# Patient Record
Sex: Female | Born: 1970 | Race: White | Hispanic: No | Smoking: Former smoker
Health system: Southern US, Community
[De-identification: ages and names within clinical notes are randomized; demographics above are authoritative.]

## PROBLEM LIST (undated history)

## (undated) HISTORY — PX: LASIK: SHX215

## (undated) HISTORY — PX: OTHER SURGICAL HISTORY: SHX169

---

## 2015-11-13 DIAGNOSIS — L638 Other alopecia areata: Secondary | ICD-10-CM | POA: Diagnosis not present

## 2016-08-28 DIAGNOSIS — R000 Tachycardia, unspecified: Secondary | ICD-10-CM | POA: Diagnosis not present

## 2016-08-28 DIAGNOSIS — Z0000 Encounter for general adult medical examination without abnormal findings: Secondary | ICD-10-CM | POA: Diagnosis not present

## 2016-08-28 DIAGNOSIS — L659 Nonscarring hair loss, unspecified: Secondary | ICD-10-CM | POA: Diagnosis not present

## 2016-08-28 DIAGNOSIS — R8299 Other abnormal findings in urine: Secondary | ICD-10-CM | POA: Diagnosis not present

## 2016-08-28 DIAGNOSIS — Z6822 Body mass index (BMI) 22.0-22.9, adult: Secondary | ICD-10-CM | POA: Diagnosis not present

## 2016-08-28 DIAGNOSIS — Z23 Encounter for immunization: Secondary | ICD-10-CM | POA: Diagnosis not present

## 2016-09-02 ENCOUNTER — Other Ambulatory Visit: Payer: Self-pay | Admitting: Internal Medicine

## 2016-09-02 DIAGNOSIS — Z1231 Encounter for screening mammogram for malignant neoplasm of breast: Secondary | ICD-10-CM

## 2016-09-24 ENCOUNTER — Ambulatory Visit
Admission: RE | Admit: 2016-09-24 | Discharge: 2016-09-24 | Disposition: A | Discharge status: Home / Self Care | Payer: BLUE CROSS/BLUE SHIELD | Source: Ambulatory Visit | Attending: Internal Medicine | Admitting: Internal Medicine

## 2016-09-24 DIAGNOSIS — Z1231 Encounter for screening mammogram for malignant neoplasm of breast: Secondary | ICD-10-CM | POA: Diagnosis not present

## 2017-01-09 DIAGNOSIS — Z6822 Body mass index (BMI) 22.0-22.9, adult: Secondary | ICD-10-CM | POA: Diagnosis not present

## 2017-01-09 DIAGNOSIS — E559 Vitamin D deficiency, unspecified: Secondary | ICD-10-CM | POA: Diagnosis not present

## 2017-01-09 DIAGNOSIS — R5381 Other malaise: Secondary | ICD-10-CM | POA: Diagnosis not present

## 2017-01-09 DIAGNOSIS — E611 Iron deficiency: Secondary | ICD-10-CM | POA: Diagnosis not present

## 2017-05-28 ENCOUNTER — Encounter: Payer: Self-pay | Admitting: Obstetrics & Gynecology

## 2017-05-28 ENCOUNTER — Ambulatory Visit: Payer: BLUE CROSS/BLUE SHIELD | Admitting: Obstetrics & Gynecology

## 2017-05-28 VITALS — BP 128/70 | Ht 61.75 in | Wt 117.0 lb

## 2017-05-28 DIAGNOSIS — Z1151 Encounter for screening for human papillomavirus (HPV): Secondary | ICD-10-CM | POA: Diagnosis not present

## 2017-05-28 DIAGNOSIS — L659 Nonscarring hair loss, unspecified: Secondary | ICD-10-CM

## 2017-05-28 DIAGNOSIS — Z113 Encounter for screening for infections with a predominantly sexual mode of transmission: Secondary | ICD-10-CM | POA: Diagnosis not present

## 2017-05-28 DIAGNOSIS — H02729 Madarosis of unspecified eye, unspecified eyelid and periocular area: Secondary | ICD-10-CM

## 2017-05-28 DIAGNOSIS — Z01419 Encounter for gynecological examination (general) (routine) without abnormal findings: Secondary | ICD-10-CM | POA: Diagnosis not present

## 2017-05-28 DIAGNOSIS — Z3049 Encounter for surveillance of other contraceptives: Secondary | ICD-10-CM | POA: Diagnosis not present

## 2017-05-28 NOTE — Progress Notes (Signed)
Deborah Mayo 24-Nov-1970 [id number]   History:    46 y.o. G0 boyfriend times 3 years.  RP:  New patient presenting for annual gyn exam  HPI: Normal menstrual periods every month.  Normal flow.  No pelvic pain.  Stable boyfriend for 3 years.  Using the diaphragm.  Would like STD screening.  Breasts normal.  Last mammogram April 2018 normal.  Urine and bowel movements normal.  Complains of hair loss and loss of her eyebrows.  Health labs with family physician.  Past medical history,surgical history, family history and social history were all reviewed and documented in the EPIC chart.  Gynecologic History Patient's last menstrual period was 05/07/2017. Contraception: diaphragm Last Pap: 2015. Results were: normal Last mammogram: 2018. Results were: Normal  Obstetric History OB History  Gravida Para Term Preterm AB Living  0 0 0 0 0 0  SAB TAB Ectopic Multiple Live Births  0 0 0 0 0         ROS: A ROS was performed and pertinent positives and negatives are included in the history.  GENERAL: No fevers or chills. HEENT: No change in vision, no earache, sore throat or sinus congestion. NECK: No pain or stiffness. CARDIOVASCULAR: No chest pain or pressure. No palpitations. PULMONARY: No shortness of breath, cough or wheeze. GASTROINTESTINAL: No abdominal pain, nausea, vomiting or diarrhea, melena or bright red blood per rectum. GENITOURINARY: No urinary frequency, urgency, hesitancy or dysuria. MUSCULOSKELETAL: No joint or muscle pain, no back pain, no recent trauma. DERMATOLOGIC: No rash, no itching, no lesions. ENDOCRINE: No polyuria, polydipsia, no heat or cold intolerance. No recent change in weight. HEMATOLOGICAL: No anemia or easy bruising or bleeding. NEUROLOGIC: No headache, seizures, numbness, tingling or weakness. PSYCHIATRIC: No depression, no loss of interest in normal activity or change in sleep pattern.     Exam:   BP 128/70   Ht 5' 1.75" (1.568 m)   Wt 117 lb (53.1 kg)    LMP 05/07/2017   BMI 21.57 kg/m   Body mass index is 21.57 kg/m.  General appearance : Well developed well nourished female. No acute distress HEENT: Eyes: No retinal hemorrhage or exudates,  Neck supple, trachea midline, no carotid bruits, no thyroidmegaly Skin:  Partial loss of her eyebrows Lungs: Clear to auscultation, no rhonchi or wheezes, or rib retractions  Heart: Regular rate and rhythm, no murmurs or gallops Breast:Examined in sitting and supine position were symmetrical in appearance, no palpable masses or tenderness,  no skin retraction, no nipple inversion, no nipple discharge, no skin discoloration, no axillary or supraclavicular lymphadenopathy Abdomen: no palpable masses or tenderness, no rebound or guarding Extremities: no edema or skin discoloration or tenderness  Pelvic: Vulva normal  Bartholin, Urethra, Skene Glands: Within normal limits             Vagina: No gross lesions or discharge  Cervix: No gross lesions or discharge.  Pap/HPV HR/Gono-Chlam done.  Uterus  AV, normal size, shape and consistency, non-tender and mobile  Adnexa  Without masses or tenderness  Anus and perineum  normal    Assessment/Plan:  46 y.o. female for annual exam   1. Encounter for routine gynecological examination with Papanicolaou smear of cervix Normal gynecologic exam.  Pap test with high risk HPV done.  Breast exam normal.  Screening mammogram negative in 2018.  Health labs with family physician.  - Vitamin D 1,25 dihydroxy  2. Encounter for surveillance of other contraceptive Using a diaphragm.  3. Hair loss  Rule out endocrine disorder.   - Testos,Total,Free and SHBG (Female) - TSH - Prolactin  4. 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  Seen by dermatology and no skin disorder found.  Offered referral to endocrinology but prefers to wait on above results.  5. Screen for STD (sexually transmitted disease) Recommend condom use - HIV antibody (with  reflex) - RPR - Hepatitis B Surface AntiGEN - Hepatitis C Antibody  Counseling on above issues more than 50% for 20 minutes.  Deborah DelMarie-Lyne Baron Parmelee MD, 12:22 PM 05/28/2017

## 2017-05-29 LAB — PROLACTIN: PROLACTIN: 6.9 ng/mL

## 2017-05-29 LAB — TSH: TSH: 1.33 mIU/L

## 2017-05-31 ENCOUNTER — Encounter: Payer: Self-pay | Admitting: Obstetrics & Gynecology

## 2017-05-31 NOTE — Patient Instructions (Signed)
1. Encounter for routine gynecological examination with Papanicolaou smear of cervix Normal gynecologic exam.  Pap test with high risk HPV done.  Breast exam normal.  Screening mammogram negative in 2018.  Health labs with family physician.  - Vitamin D 1,25 dihydroxy  2. Encounter for surveillance of other contraceptive Using a diaphragm.  3. Hair loss Rule out endocrine disorder.   - Testos,Total,Free and SHBG (Female) - TSH - Prolactin  4. 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  Seen by dermatology and no skin disorder found.  Offered referral to endocrinology but prefers to wait on above results.  5. Screen for STD (sexually transmitted disease) Recommend condom use - HIV antibody (with reflex) - RPR - Hepatitis B Surface AntiGEN - Hepatitis C Antibody  Deborah Mayo, it was a pleasure meeting you today!  I will inform you of your lab results as soon as available.   Alopecia Areata, Adult Alopecia areata is a condition that causes you to lose hair. You may lose hair on your scalp in patches. In some cases, you may lose all the hair on your scalp (alopecia totalis) or all the hair from your face and body (alopecia universalis). Alopecia areata is an autoimmune disease. This means that your body's defense system (immune system) mistakes normal parts of the body for germs or other things that can make you sick. When you have alopecia areata, the immune system attacks the hair follicles. Alopecia areata usually develops in childhood, but it can develop at any age. For some people, their hair grows back on its own and hair loss does not happen again. For others, their hair may fall out and grow back in cycles. The hair loss may last many years. Having this condition can be emotionally difficult, but it is not dangerous. What are the causes? The cause of this condition is not known. What increases the risk? This condition is more likely to develop in people who have:  A  family history of alopecia.  A family history of another autoimmune disease, including type 1 diabetes and rheumatoid arthritis.  Asthma and allergies.  Down syndrome.  What are the signs or symptoms? Round spots of patchy hair loss on the scalp is the main symptom of this condition. The spots may be mildly itchy. Other symptoms include:  Short dark hairs in the bald patches that are wider at the top (exclamation point hairs).  Dents, white spots, or lines in the fingernails or toenails.  Balding and body hair loss. This is rare.  How is this diagnosed? This condition is diagnosed based on your symptoms and family history. Your health care provider will also check your scalp skin, teeth, and nails. Your health care provider may refer you to a specialist in hair and skin disorders (dermatologist). You may also have tests, including:  A hair pull test.  Blood tests or other screening tests to check for autoimmune diseases, such as thyroid disease or diabetes.  Skin biopsy to confirm the diagnosis.  A procedure to examine the skin with a lighted magnifying instrument (dermoscopy).  How is this treated? There is no cure for alopecia areata. Treatment is aimed at promoting the regrowth of hair and preventing the immune system from overreacting. No single treatment is right for all people with alopecia areata. It depends on the type of hair loss you have and how severe it is. Work with your health care provider to find the best treatment for you. Treatment may include:  Having  regular checkups to make sure the condition is not getting worse (watchful waiting).  Steroid creams or pills for 6-8 weeks to stop the immune reaction and help hair to regrow more quickly.  Other topical medicines to alter the immune system response and support the hair growth cycle.  Steroid injections.  Therapy and counseling with a support group or therapist if you are having trouble coping with hair  loss.  Follow these instructions at home:  Learn as much as you can about your condition.  Apply topical creams only as told by your health care provider.  Take over-the-counter and prescription medicines only as told by your health care provider.  Consider getting a wig or products to make hair look fuller or to cover bald spots, if you feel uncomfortable with your appearance.  Get therapy or counseling if you are having a hard time coping with hair loss. Ask your health care provider to recommend a counselor or support group.  Keep all follow-up visits as told by your health care provider. This is important. Contact a health care provider if:  Your hair loss gets worse, even with treatment.  You have new symptoms.  You are struggling emotionally. Summary  Alopecia areata is an autoimmune condition that makes your body's defense system (immune system) attack the hair follicles. This causes you to lose hair.  Treatments may include regular checkups to make sure that the condition is not getting worse (watchful waiting), medicines, and steroid injections. This information is not intended to replace advice given to you by your health care provider. Make sure you discuss any questions you have with your health care provider. Document Released: 01/12/2004 Document Revised: 06/27/2016 Document Reviewed: 06/27/2016 Elsevier Interactive Patient Education  2018 [employer]

## 2017-06-01 LAB — HEPATITIS C ANTIBODY
Hepatitis C Ab: NONREACTIVE
SIGNAL TO CUT-OFF: 0.02 (ref <1.00)

## 2017-06-01 LAB — PAP IG, CT-NG NAA, HPV HIGH-RISK
C. trachomatis RNA, TMA: NOT DETECTED
HPV DNA High Risk: NOT DETECTED
N. GONORRHOEAE RNA, TMA: NOT DETECTED

## 2017-06-01 LAB — HIV ANTIBODY (ROUTINE TESTING W REFLEX): HIV 1&2 Ab, 4th Generation: NONREACTIVE

## 2017-06-01 LAB — VITAMIN D 1,25 DIHYDROXY
VITAMIN D 1, 25 (OH) TOTAL: 44 pg/mL (ref 18–72)
Vitamin D2 1, 25 (OH)2: <8 pg/mL
Vitamin D3 1, 25 (OH)2: 44 pg/mL

## 2017-06-01 LAB — TESTOS,TOTAL,FREE AND SHBG (FEMALE): Sex Hormone Binding: 83 nmol/L (ref 17–124)

## 2017-06-01 LAB — HEPATITIS B SURFACE ANTIGEN: HEP B S AG: NONREACTIVE

## 2017-06-01 LAB — RPR: RPR: NONREACTIVE

## 2017-08-27 DIAGNOSIS — E559 Vitamin D deficiency, unspecified: Secondary | ICD-10-CM | POA: Diagnosis not present

## 2017-08-27 DIAGNOSIS — Z0000 Encounter for general adult medical examination without abnormal findings: Secondary | ICD-10-CM | POA: Diagnosis not present

## 2017-09-03 DIAGNOSIS — Z6823 Body mass index (BMI) 23.0-23.9, adult: Secondary | ICD-10-CM | POA: Diagnosis not present

## 2017-09-03 DIAGNOSIS — Z1389 Encounter for screening for other disorder: Secondary | ICD-10-CM | POA: Diagnosis not present

## 2017-09-03 DIAGNOSIS — Z0000 Encounter for general adult medical examination without abnormal findings: Secondary | ICD-10-CM | POA: Diagnosis not present

## 2017-09-21 IMAGING — MG 2D DIGITAL SCREENING BILATERAL MAMMOGRAM WITH CAD AND ADJUNCT TO
9 of 13 series · 9 of 29 positions shown · non-contrast
Comparison: None.

CLINICAL DATA: Screening.

EXAM:
2D DIGITAL SCREENING BILATERAL MAMMOGRAM WITH CAD AND ADJUNCT TOMO

[L XCCL]
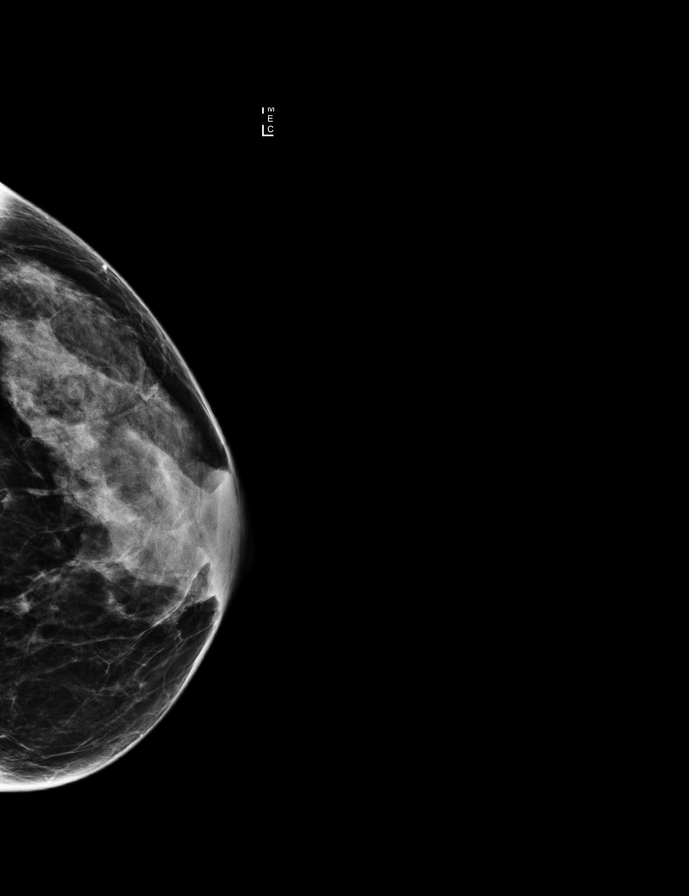

[R MLO]
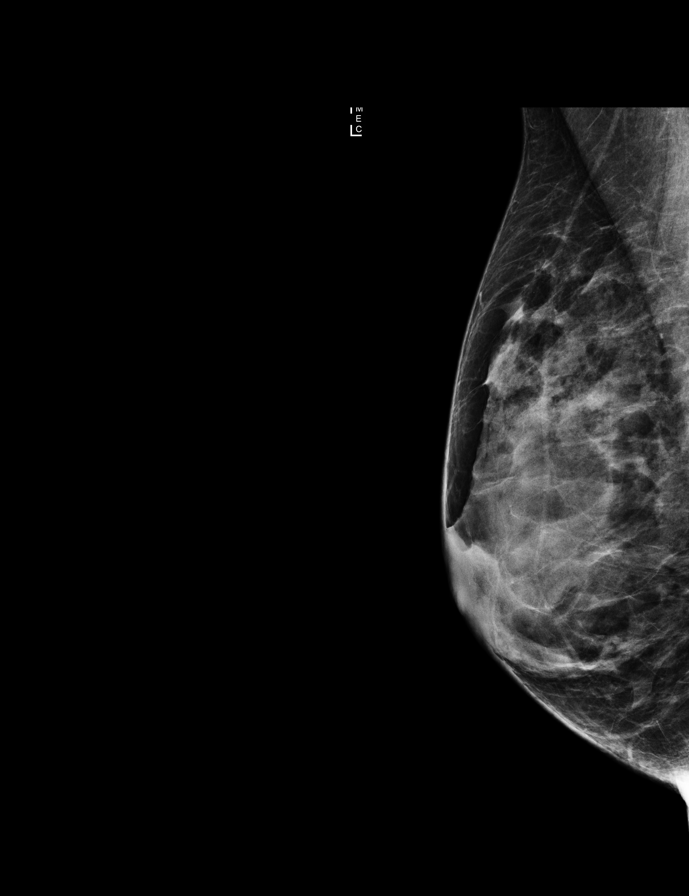

[R MLO synth-2D]
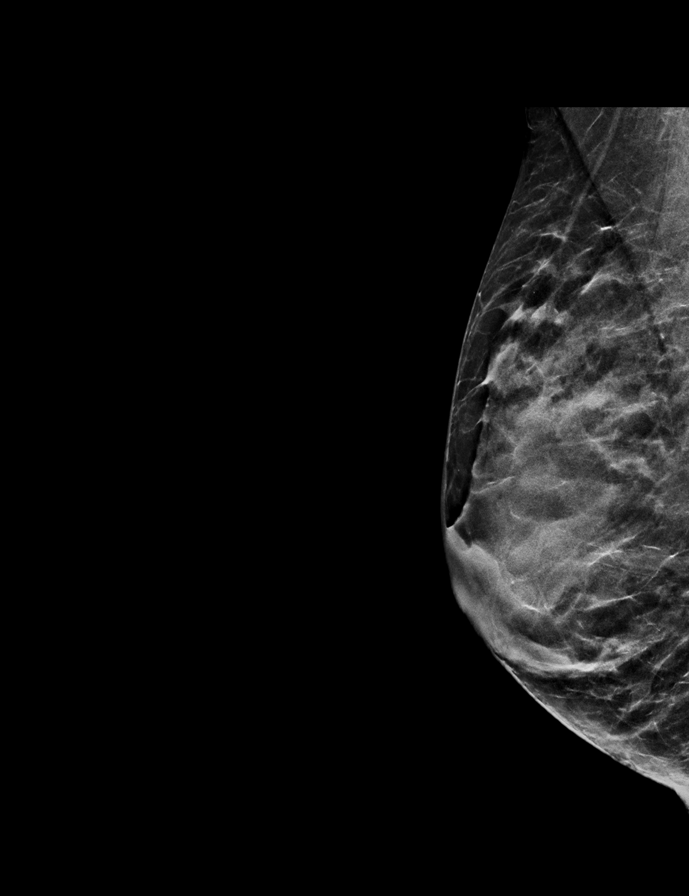

[R CC]
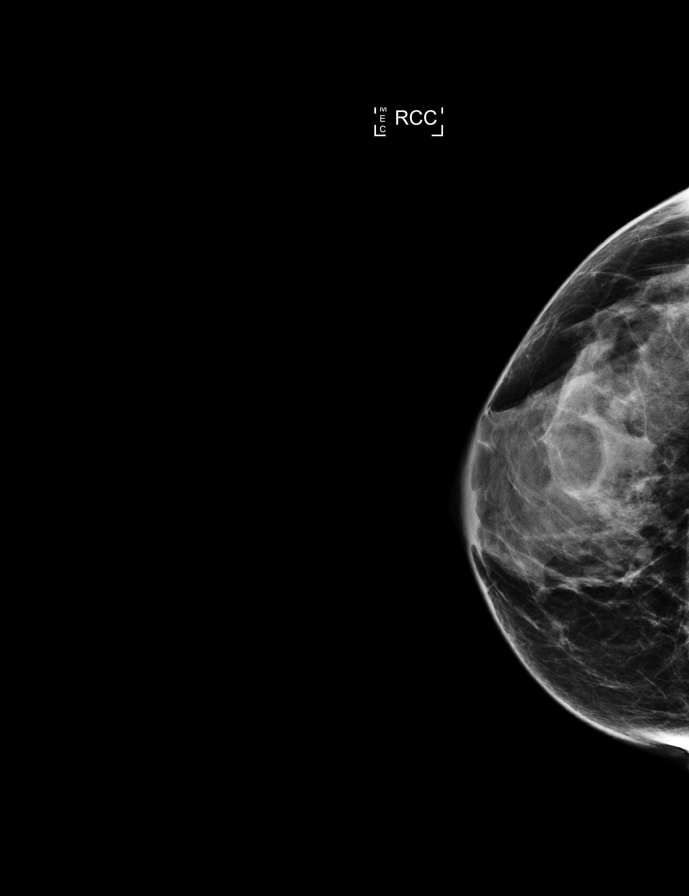

[R CC synth-2D]
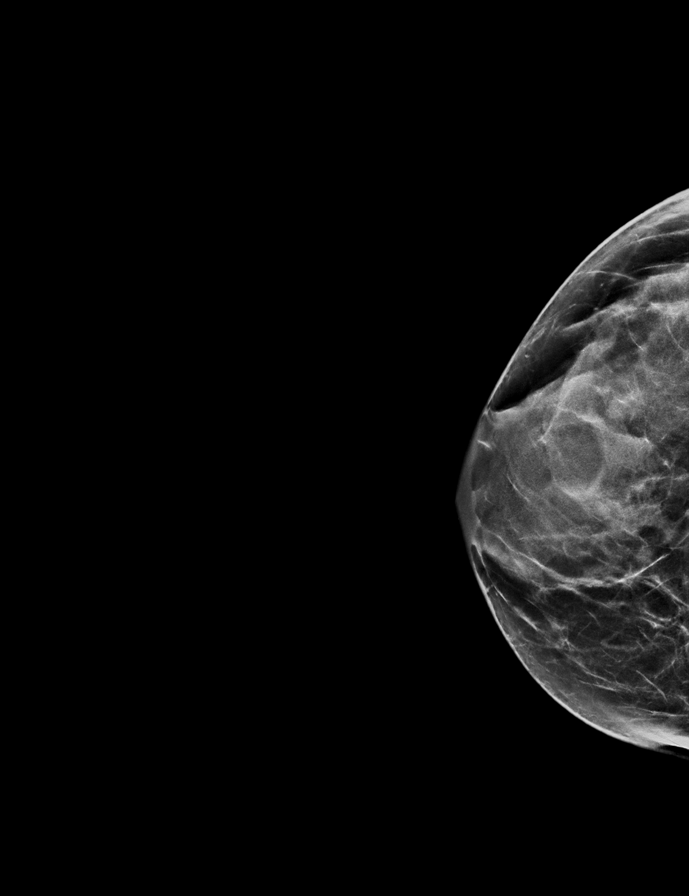

[L MLO]
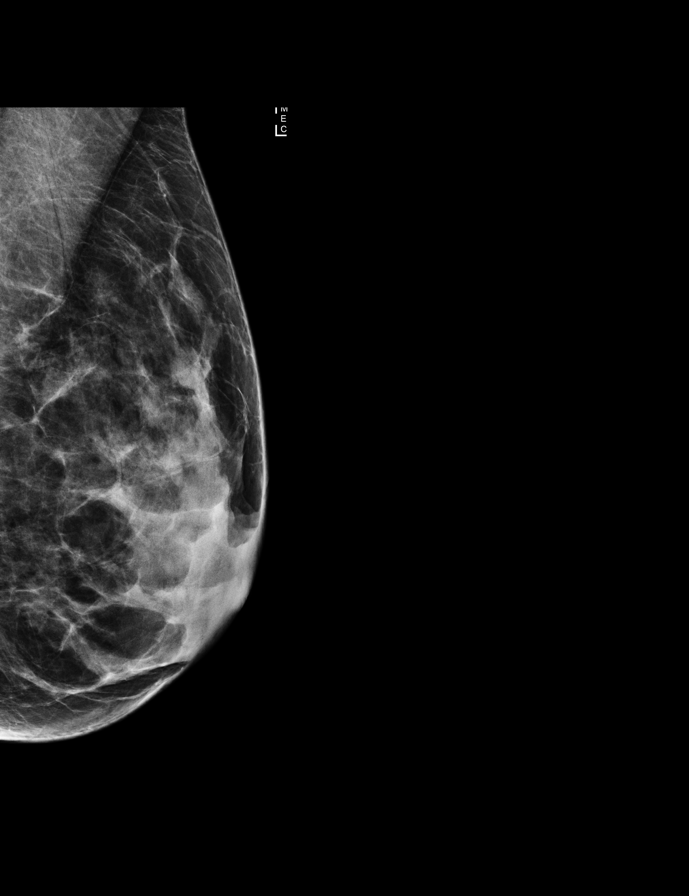

[L CC]
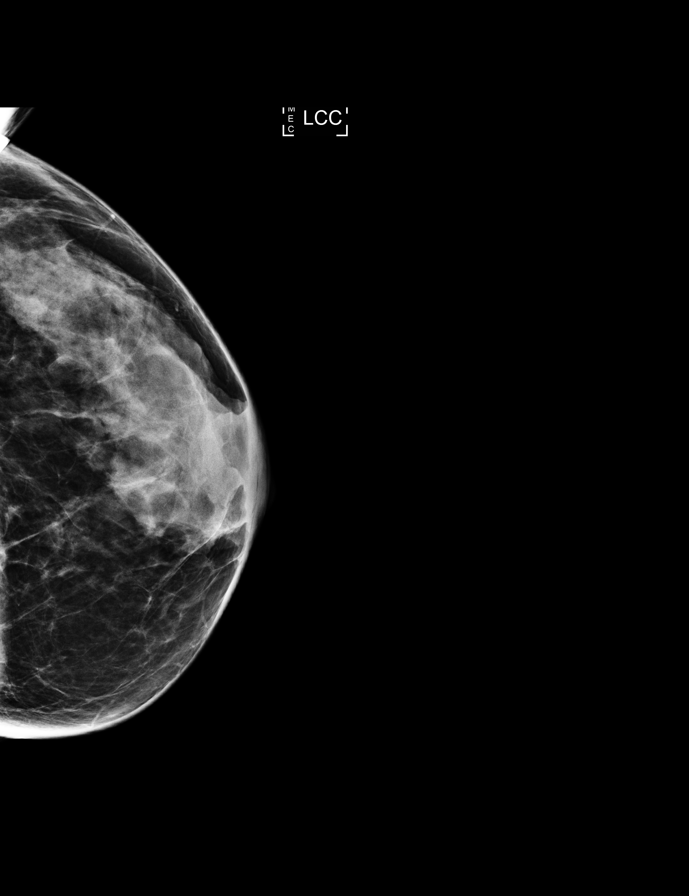

[L MLO synth-2D]
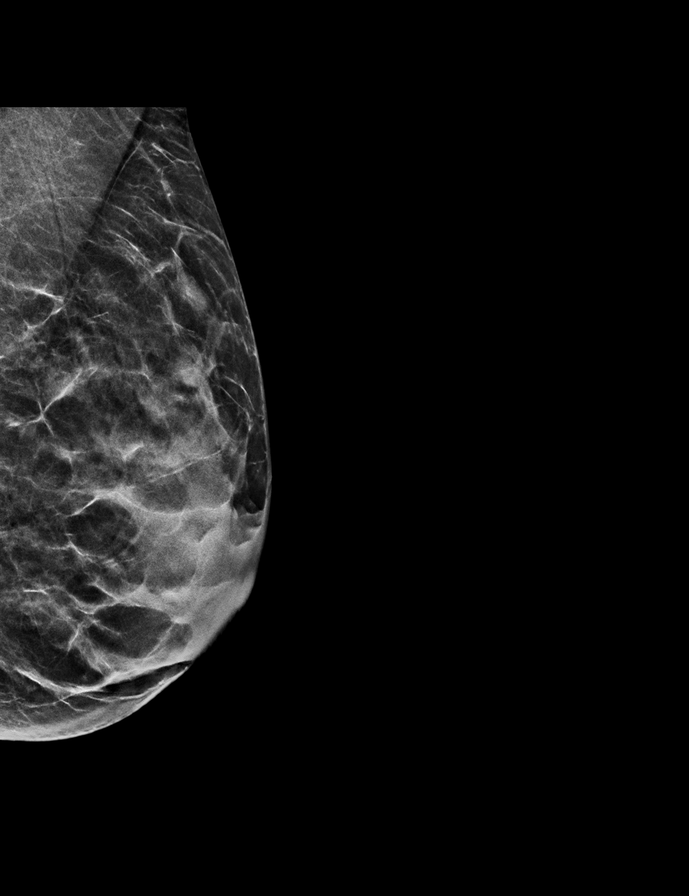

[L CC synth-2D]
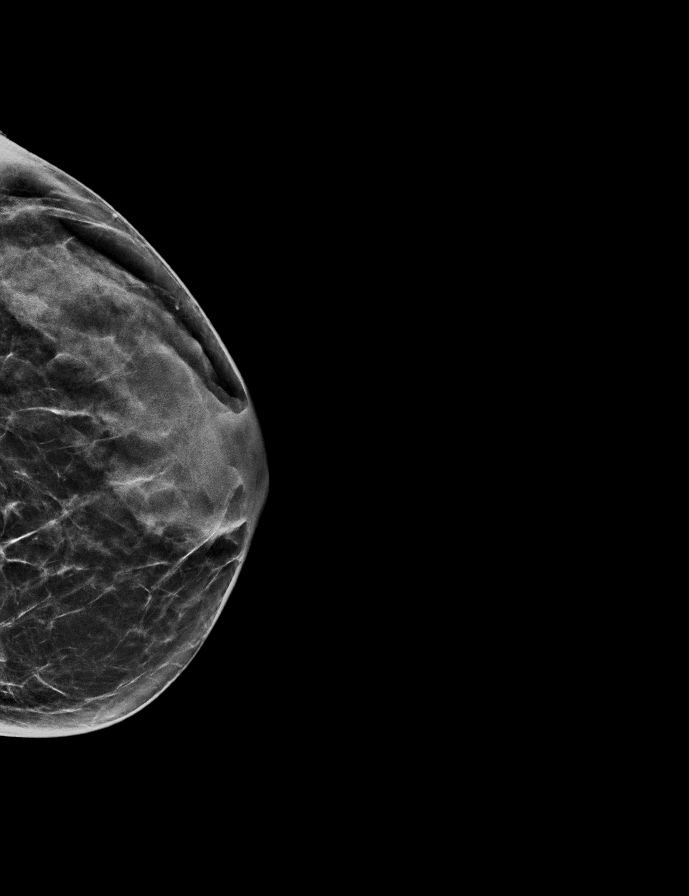

[9 of 29 positions shown; findings below may reference images not displayed]

ACR Breast Density Category c: The breast tissue is heterogeneously
dense, which may obscure small masses
FINDINGS: There are no findings suspicious for malignancy. Images were
processed with CAD.
IMPRESSION: No mammographic evidence of malignancy. A result letter of this
screening mammogram will be mailed directly to the patient.

RECOMMENDATION:
Screening mammogram in one year. (Code:KQ-2-WJT)

BI-RADS CATEGORY  1: Negative.

## 2018-08-24 DIAGNOSIS — R000 Tachycardia, unspecified: Secondary | ICD-10-CM | POA: Diagnosis not present

## 2018-08-24 DIAGNOSIS — Z8249 Family history of ischemic heart disease and other diseases of the circulatory system: Secondary | ICD-10-CM | POA: Diagnosis not present

## 2018-08-24 DIAGNOSIS — S72002A Fracture of unspecified part of neck of left femur, initial encounter for closed fracture: Secondary | ICD-10-CM | POA: Diagnosis not present

## 2018-08-24 DIAGNOSIS — S72012A Unspecified intracapsular fracture of left femur, initial encounter for closed fracture: Secondary | ICD-10-CM | POA: Diagnosis not present

## 2018-08-24 DIAGNOSIS — T1490XA Injury, unspecified, initial encounter: Secondary | ICD-10-CM | POA: Diagnosis not present

## 2018-08-24 DIAGNOSIS — S72142D Displaced intertrochanteric fracture of left femur, subsequent encounter for closed fracture with routine healing: Secondary | ICD-10-CM | POA: Diagnosis not present

## 2018-08-24 DIAGNOSIS — R42 Dizziness and giddiness: Secondary | ICD-10-CM | POA: Diagnosis not present

## 2018-08-24 DIAGNOSIS — M25552 Pain in left hip: Secondary | ICD-10-CM | POA: Diagnosis not present

## 2018-08-24 DIAGNOSIS — W108XXA Fall (on) (from) other stairs and steps, initial encounter: Secondary | ICD-10-CM | POA: Diagnosis not present

## 2018-08-26 DIAGNOSIS — S72002A Fracture of unspecified part of neck of left femur, initial encounter for closed fracture: Secondary | ICD-10-CM | POA: Diagnosis not present

## 2018-08-27 DIAGNOSIS — Z79891 Long term (current) use of opiate analgesic: Secondary | ICD-10-CM | POA: Diagnosis not present

## 2018-08-27 DIAGNOSIS — Z9181 History of falling: Secondary | ICD-10-CM | POA: Diagnosis not present

## 2018-08-27 DIAGNOSIS — Z7901 Long term (current) use of anticoagulants: Secondary | ICD-10-CM | POA: Diagnosis not present

## 2018-08-27 DIAGNOSIS — S72142D Displaced intertrochanteric fracture of left femur, subsequent encounter for closed fracture with routine healing: Secondary | ICD-10-CM | POA: Diagnosis not present

## 2018-08-30 DIAGNOSIS — Z7901 Long term (current) use of anticoagulants: Secondary | ICD-10-CM | POA: Diagnosis not present

## 2018-08-30 DIAGNOSIS — Z79891 Long term (current) use of opiate analgesic: Secondary | ICD-10-CM | POA: Diagnosis not present

## 2018-08-30 DIAGNOSIS — S72142D Displaced intertrochanteric fracture of left femur, subsequent encounter for closed fracture with routine healing: Secondary | ICD-10-CM | POA: Diagnosis not present

## 2018-08-30 DIAGNOSIS — Z9181 History of falling: Secondary | ICD-10-CM | POA: Diagnosis not present

## 2018-09-01 DIAGNOSIS — S72142D Displaced intertrochanteric fracture of left femur, subsequent encounter for closed fracture with routine healing: Secondary | ICD-10-CM | POA: Diagnosis not present

## 2018-09-01 DIAGNOSIS — Z9181 History of falling: Secondary | ICD-10-CM | POA: Diagnosis not present

## 2018-09-01 DIAGNOSIS — Z7901 Long term (current) use of anticoagulants: Secondary | ICD-10-CM | POA: Diagnosis not present

## 2018-09-01 DIAGNOSIS — Z79891 Long term (current) use of opiate analgesic: Secondary | ICD-10-CM | POA: Diagnosis not present

## 2018-09-03 DIAGNOSIS — S72002A Fracture of unspecified part of neck of left femur, initial encounter for closed fracture: Secondary | ICD-10-CM | POA: Diagnosis not present

## 2018-09-06 DIAGNOSIS — Z79891 Long term (current) use of opiate analgesic: Secondary | ICD-10-CM | POA: Diagnosis not present

## 2018-09-06 DIAGNOSIS — S72142D Displaced intertrochanteric fracture of left femur, subsequent encounter for closed fracture with routine healing: Secondary | ICD-10-CM | POA: Diagnosis not present

## 2018-09-06 DIAGNOSIS — Z7901 Long term (current) use of anticoagulants: Secondary | ICD-10-CM | POA: Diagnosis not present

## 2018-09-06 DIAGNOSIS — Z9181 History of falling: Secondary | ICD-10-CM | POA: Diagnosis not present

## 2018-10-05 DIAGNOSIS — S72002A Fracture of unspecified part of neck of left femur, initial encounter for closed fracture: Secondary | ICD-10-CM | POA: Diagnosis not present

## 2018-11-16 DIAGNOSIS — S72002A Fracture of unspecified part of neck of left femur, initial encounter for closed fracture: Secondary | ICD-10-CM | POA: Diagnosis not present

## 2018-11-23 DIAGNOSIS — Z8781 Personal history of (healed) traumatic fracture: Secondary | ICD-10-CM | POA: Diagnosis not present

## 2018-11-23 DIAGNOSIS — Z79899 Other long term (current) drug therapy: Secondary | ICD-10-CM | POA: Diagnosis not present

## 2018-11-23 DIAGNOSIS — Z7189 Other specified counseling: Secondary | ICD-10-CM | POA: Diagnosis not present

## 2018-11-23 DIAGNOSIS — M810 Age-related osteoporosis without current pathological fracture: Secondary | ICD-10-CM | POA: Diagnosis not present

## 2018-11-23 DIAGNOSIS — X58XXXS Exposure to other specified factors, sequela: Secondary | ICD-10-CM | POA: Diagnosis not present

## 2018-11-23 DIAGNOSIS — M80052D Age-related osteoporosis with current pathological fracture, left femur, subsequent encounter for fracture with routine healing: Secondary | ICD-10-CM | POA: Diagnosis not present

## 2018-11-23 DIAGNOSIS — M84452S Pathological fracture, left femur, sequela: Secondary | ICD-10-CM | POA: Diagnosis not present

## 2018-12-10 DIAGNOSIS — M810 Age-related osteoporosis without current pathological fracture: Secondary | ICD-10-CM | POA: Diagnosis not present

## 2018-12-10 DIAGNOSIS — Z7189 Other specified counseling: Secondary | ICD-10-CM | POA: Diagnosis not present

## 2019-04-12 ENCOUNTER — Other Ambulatory Visit: Payer: Self-pay | Admitting: Internal Medicine

## 2019-04-12 DIAGNOSIS — Z1231 Encounter for screening mammogram for malignant neoplasm of breast: Secondary | ICD-10-CM

## 2020-06-08 ENCOUNTER — Other Ambulatory Visit: Payer: Self-pay | Admitting: Internal Medicine

## 2020-06-08 DIAGNOSIS — Z1231 Encounter for screening mammogram for malignant neoplasm of breast: Secondary | ICD-10-CM

## 2023-08-19 ENCOUNTER — Ambulatory Visit (AMBULATORY_SURGERY_CENTER): Payer: Commercial Managed Care - PPO

## 2023-08-19 VITALS — Ht 60.0 in | Wt 140.0 lb

## 2023-08-19 DIAGNOSIS — Z1211 Encounter for screening for malignant neoplasm of colon: Secondary | ICD-10-CM

## 2023-08-19 MED ORDER — SUFLAVE 178.7 G PO SOLR: 1.0000 | ORAL | 0 refills | Status: DC

## 2023-08-19 NOTE — Progress Notes (Signed)
 No egg or soy allergy known to patient  No issues known to pt with past sedation with any surgeries or procedures Patient denies ever being told they had issues or difficulty with intubation  No FH of Malignant Hyperthermia Pt is not on diet pills Pt is not on  home 02  Pt is not on blood thinners  Pt denies issues with constipation  No A fib or A flutter Have any cardiac testing pending-- no  LOA: independent  Prep: suflave    PV completed with patient. Prep instructions sent via mychart and home address.

## 2023-08-26 ENCOUNTER — Encounter: Payer: Self-pay | Admitting: Internal Medicine

## 2023-08-31 ENCOUNTER — Ambulatory Visit (AMBULATORY_SURGERY_CENTER): Payer: BLUE CROSS/BLUE SHIELD | Admitting: Internal Medicine

## 2023-08-31 ENCOUNTER — Encounter: Payer: Self-pay | Admitting: Internal Medicine

## 2023-08-31 VITALS — BP 122/86 | HR 68 | Temp 98.2°F | Resp 17 | Ht 61.75 in | Wt 140.0 lb

## 2023-08-31 DIAGNOSIS — K5730 Diverticulosis of large intestine without perforation or abscess without bleeding: Secondary | ICD-10-CM | POA: Diagnosis not present

## 2023-08-31 DIAGNOSIS — Z1211 Encounter for screening for malignant neoplasm of colon: Secondary | ICD-10-CM

## 2023-08-31 MED ORDER — SODIUM CHLORIDE 0.9 % IV SOLN: 500.0000 mL | Freq: Once | INTRAVENOUS | Status: DC

## 2023-08-31 NOTE — Progress Notes (Signed)
 Pt sedate, gd SR's, VSS, report to RN

## 2023-08-31 NOTE — Progress Notes (Signed)
 [facility] History and Physical   Primary Care Physician:  Tisovec, Adelfa Koh, MD   Reason for Procedure:    Encounter Diagnosis  Name Primary?   Special screening for malignant neoplasms, colon Yes     Plan:    colonoscopy     HPI: Deborah Mayo is a 53 y.o. female for a screening colonoscopy   History reviewed. No pertinent past medical history.  Past Surgical History:  Procedure Laterality Date   femoral neck repair     LASIK Bilateral     Prior to Admission medications   Medication Sig Start Date End Date Taking? Authorizing Provider  Calcium Carbonate (CALCIUM 600 PO) Take 1 tablet by mouth daily at 6 (six) AM.   Yes [provider]  cholecalciferol (VITAMIN D) 1000 units tablet Take 5,000 Units by mouth daily.   Yes [provider]  Estradiol-Norethindrone Acet 0.5-0.1 MG tablet Take 0.5-1 tablets by mouth daily.   Yes [provider]  Ferrous Sulfate (IRON) 325 (65 Fe) MG TABS Take by mouth.    [provider]  hydrocortisone 2.5 % cream Apply 1 Application topically daily as needed. Patient not taking: Reported on 08/19/2023 07/27/23   [provider]    Current Outpatient Medications  Medication Sig Dispense Refill   Calcium Carbonate (CALCIUM 600 PO) Take 1 tablet by mouth daily at 6 (six) AM.     cholecalciferol (VITAMIN D) 1000 units tablet Take 5,000 Units by mouth daily.     Estradiol-Norethindrone Acet 0.5-0.1 MG tablet Take 0.5-1 tablets by mouth daily.     Ferrous Sulfate (IRON) 325 (65 Fe) MG TABS Take by mouth.     hydrocortisone 2.5 % cream Apply 1 Application topically daily as needed. (Patient not taking: Reported on 08/19/2023)     Current Facility-Administered Medications  Medication Dose Route Frequency Provider Last Rate Last Admin   0.9 %  sodium chloride infusion  500 mL Intravenous Once Iva Boop, MD        Allergies as of 08/31/2023   (No Active Allergies)    Family  History  Problem Relation Age of Onset   Hypertension Father    Breast cancer Neg Hx    Colon cancer Neg Hx    Rectal cancer Neg Hx    Stomach cancer Neg Hx     Social History   Socioeconomic History   Marital status: Divorced    Spouse name: Not on file   Number of children: Not on file   Years of education: Not on file   Highest education level: Not on file  Occupational History   Not on file  Tobacco Use   Smoking status: Former    Current packs/day: 0.00    Types: Cigarettes    Quit date: 05/28/2001    Years since quitting: 22.2   Smokeless tobacco: Never  Vaping Use   Vaping status: Never Used  Substance and Sexual Activity   Alcohol use: No    Comment: OCC    Drug use: Never   Sexual activity: Yes    Partners: Male    Comment: 1st intercourse- 19, partners- 22, current partner- 3 yrs   Other Topics Concern   Not on file  Social History Narrative   Not on file   Social Drivers of Health   Financial Resource Strain: Not on file  Food Insecurity: Not on file  Transportation Needs: Not on file  Physical Activity: Not on file  Stress: Not on  file  Social Connections: Not on file  Intimate Partner Violence: Not on file    Review of Systems:  All other review of systems negative except as mentioned in the HPI.  Physical Exam: Vital signs BP 130/77   Pulse 64   Temp 98.2 F (36.8 C) (Skin)   Ht 5' 1.75" (1.568 m)   Wt 140 lb (63.5 kg)   LMP 05/07/2017   SpO2 96%   BMI 25.81 kg/m   General:   Alert,  Well-developed, well-nourished, pleasant and cooperative in NAD Lungs:  Clear throughout to auscultation.   Heart:  Regular rate and rhythm; no murmurs, clicks, rubs,  or gallops. Abdomen:  Soft, nontender and nondistended. Normal bowel sounds.   Neuro/Psych:  Alert and cooperative. Normal mood and affect. A and O x 3   @Jujuan Dugo  Sena Slate, MD, [facility] [phone] (pager) 08/31/2023 11:17 AM@

## 2023-08-31 NOTE — Progress Notes (Signed)
 Pt's states no medical or surgical changes since previsit or office visit.

## 2023-08-31 NOTE — Op Note (Signed)
 [facility] Patient Name: Deborah Mayo Procedure Date: 08/31/2023 11:17 AM MRN: [id number] Endoscopist: Iva Boop , MD, [id number] Age: 53 Referring MD:  Date of Birth: [birth year] Gender: Female Account #: [id number] Procedure:                Colonoscopy Indications:              Screening for colorectal malignant neoplasm, This                            is the patient's first colonoscopy Medicines:                Monitored Anesthesia Care Procedure:                Pre-Anesthesia Assessment:                           - Prior to the procedure, a History and Physical                            was performed, and patient medications and                            allergies were reviewed. The patient's tolerance of                            previous anesthesia was also reviewed. The risks                            and benefits of the procedure and the sedation                            options and risks were discussed with the patient.                            All questions were answered, and informed consent                            was obtained. Prior Anticoagulants: The patient has                            taken no anticoagulant or antiplatelet agents. ASA                            Grade Assessment: I - A normal, healthy patient.                            After reviewing the risks and benefits, the patient                            was deemed in satisfactory condition to undergo the                            procedure.  After obtaining informed consent, the colonoscope                            was passed under direct vision. Throughout the                            procedure, the patient's blood pressure, pulse, and                            oxygen saturations were monitored continuously. The                            Olympus Scope [id number] was introduced through the                            anus and advanced to the the  cecum, identified by                            appendiceal orifice and ileocecal valve. The                            colonoscopy was performed without difficulty. The                            patient tolerated the procedure well. The quality                            of the bowel preparation was good. The ileocecal                            valve, appendiceal orifice, and rectum were                            photographed. The bowel preparation used was SUPREP                            via split dose instruction. Scope In: 11:27:28 AM Scope Out: 11:38:06 AM Scope Withdrawal Time: 0 hours 7 minutes 9 seconds  Total Procedure Duration: 0 hours 10 minutes 38 seconds  Findings:                 The perianal and digital rectal examinations were                            normal.                           Multiple diverticula were found in the sigmoid                            colon.                           The exam was otherwise without abnormality on  direct and retroflexion views. Complications:            No immediate complications. Estimated Blood Loss:     Estimated blood loss: none. Impression:               - Diverticulosis in the sigmoid colon.                           - The examination was otherwise normal on direct                            and retroflexion views.                           - No specimens collected. Recommendation:           - Patient has a contact number available for                            emergencies. The signs and symptoms of potential                            delayed complications were discussed with the                            patient. Return to normal activities tomorrow.                            Written discharge instructions were provided to the                            patient.                           - Resume previous diet.                           - Continue present medications.                            - Repeat colonoscopy in 10 years for screening                            purposes. Iva Boop, MD 08/31/2023 11:44:58 AM This report has been signed electronically.

## 2023-08-31 NOTE — Patient Instructions (Addendum)
 YOU HAD AN ENDOSCOPIC PROCEDURE TODAY AT THE [facility]:   Refer to the procedure report that was given to you for any specific questions about what was found during the examination.  If the procedure report does not answer your questions, please call your gastroenterologist to clarify.  If you requested that your care partner not be given the details of your procedure findings, then the procedure report has been included in a sealed envelope for you to review at your convenience later.  Please read handouts provided. Continue present medications. Repeat colonoscopy in 10 years for screening. Resume previous diet.  YOU SHOULD EXPECT: Some feelings of bloating in the abdomen. Passage of more gas than usual.  Walking can help get rid of the air that was put into your GI tract during the procedure and reduce the bloating. If you had a lower endoscopy (such as a colonoscopy or flexible sigmoidoscopy) you may notice spotting of blood in your stool or on the toilet paper. If you underwent a bowel prep for your procedure, you may not have a normal bowel movement for a few days.  Please Note:  You might notice some irritation and congestion in your nose or some drainage.  This is from the oxygen used during your procedure.  There is no need for concern and it should clear up in a day or so.  SYMPTOMS TO REPORT IMMEDIATELY:  Following lower endoscopy (colonoscopy or flexible sigmoidoscopy):  Excessive amounts of blood in the stool  Significant tenderness or worsening of abdominal pains  Swelling of the abdomen that is new, acute  Fever of 100F or higher.  For urgent or emergent issues, a gastroenterologist can be reached at any hour by calling [phone]. Do not use MyChart messaging for urgent concerns.    DIET:  We do recommend a small meal at first, but then you may proceed to your regular diet.  Drink plenty of fluids but you should avoid alcoholic beverages for 24  hours.  ACTIVITY:  You should plan to take it easy for the rest of today and you should NOT DRIVE or use heavy machinery until tomorrow (because of the sedation medicines used during the test).    FOLLOW UP: Our staff will call the number listed on your records the next business day following your procedure.  We will call around 7:15- 8:00 am to check on you and address any questions or concerns that you may have regarding the information given to you following your procedure. If we do not reach you, we will leave a message.     If any biopsies were taken you will be contacted by phone or by letter within the next 1-3 weeks.  Please call us at [phone] if you have not heard about the biopsies in 3 weeks.    SIGNATURES/CONFIDENTIALITY: You and/or your care partner have signed paperwork which will be entered into your electronic medical record.  These signatures attest to the fact that that the information above on your After Visit Summary has been reviewed and is understood.  Full responsibility of the confidentiality of this discharge information lies with you and/or your care-partner.No polyps or cancer were identified.  You do have diverticulosis - thickened muscle rings and pouches in the colon wall. Please read the handout about this condition.  Next routine colonoscopy or other screening test in 10 years - 2035.  I appreciate the opportunity to care for you. Iva Boop, MD, [facility]

## 2023-09-01 ENCOUNTER — Telehealth: Payer: Self-pay | Admitting: *Deleted

## 2023-09-01 NOTE — Telephone Encounter (Signed)
  Follow up Call-     08/31/2023   10:30 AM  Call back number  Post procedure Call Back phone  # [phone]  Permission to leave phone message Yes     Patient questions:  Do you have a fever, pain , or abdominal swelling? No. Pain Score  0 *  Have you tolerated food without any problems? Yes.    Have you been able to return to your normal activities? Yes.    Do you have any questions about your discharge instructions: Diet   No. Medications  No. Follow up visit  No.  Do you have questions or concerns about your Care? No.  Actions: * If pain score is 4 or above: No action needed, pain <4.
